# Patient Record
Sex: Female | Born: 1970 | Race: White | Hispanic: No | Marital: Single | Smoking: Never smoker
Health system: Southern US, Community
[De-identification: ages and names within clinical notes are randomized; demographics above are authoritative.]

## PROBLEM LIST (undated history)

## (undated) DIAGNOSIS — F419 Anxiety disorder, unspecified: Secondary | ICD-10-CM

## (undated) DIAGNOSIS — D649 Anemia, unspecified: Secondary | ICD-10-CM

## (undated) DIAGNOSIS — I10 Essential (primary) hypertension: Secondary | ICD-10-CM

## (undated) HISTORY — DX: Essential (primary) hypertension: I10

## (undated) HISTORY — DX: Anemia, unspecified: D64.9

## (undated) HISTORY — DX: Anxiety disorder, unspecified: F41.9

## (undated) HISTORY — PX: TUMOR REMOVAL: SHX12

---

## 2001-04-11 ENCOUNTER — Encounter: Payer: Self-pay | Admitting: Internal Medicine

## 2001-04-11 ENCOUNTER — Encounter: Admission: RE | Admit: 2001-04-11 | Discharge: 2001-04-11 | Payer: Self-pay | Admitting: Internal Medicine

## 2003-07-30 ENCOUNTER — Other Ambulatory Visit: Admission: RE | Admit: 2003-07-30 | Discharge: 2003-07-30 | Payer: Self-pay | Admitting: Internal Medicine

## 2006-08-03 ENCOUNTER — Other Ambulatory Visit: Admission: RE | Admit: 2006-08-03 | Discharge: 2006-08-03 | Payer: Self-pay | Admitting: *Deleted

## 2006-12-05 ENCOUNTER — Emergency Department (HOSPITAL_COMMUNITY): Admission: EM | Admit: 2006-12-05 | Discharge: 2006-12-05 | Payer: Self-pay | Admitting: Emergency Medicine

## 2007-12-30 ENCOUNTER — Encounter: Admission: RE | Admit: 2007-12-30 | Discharge: 2007-12-30 | Payer: Self-pay | Admitting: Orthopedic Surgery

## 2008-05-18 ENCOUNTER — Encounter: Admission: RE | Admit: 2008-05-18 | Discharge: 2008-05-18 | Payer: Self-pay | Admitting: Orthopedic Surgery

## 2008-11-02 ENCOUNTER — Other Ambulatory Visit: Admission: RE | Admit: 2008-11-02 | Discharge: 2008-11-02 | Payer: Self-pay | Admitting: Family Medicine

## 2008-12-02 ENCOUNTER — Encounter: Admission: RE | Admit: 2008-12-02 | Discharge: 2008-12-02 | Payer: Self-pay | Admitting: Orthopedic Surgery

## 2009-04-13 ENCOUNTER — Encounter: Admission: RE | Admit: 2009-04-13 | Discharge: 2009-04-13 | Payer: Self-pay | Admitting: General Surgery

## 2009-04-21 ENCOUNTER — Encounter (INDEPENDENT_AMBULATORY_CARE_PROVIDER_SITE_OTHER): Payer: Self-pay | Admitting: Interventional Radiology

## 2009-04-21 ENCOUNTER — Ambulatory Visit (HOSPITAL_COMMUNITY): Admission: RE | Admit: 2009-04-21 | Discharge: 2009-04-21 | Payer: Self-pay | Admitting: General Surgery

## 2010-02-07 ENCOUNTER — Encounter: Admission: RE | Admit: 2010-02-07 | Discharge: 2010-02-07 | Payer: Self-pay | Admitting: *Deleted

## 2010-02-11 ENCOUNTER — Encounter: Admission: RE | Admit: 2010-02-11 | Discharge: 2010-02-11 | Payer: Self-pay | Admitting: *Deleted

## 2010-08-14 ENCOUNTER — Encounter: Payer: Self-pay | Admitting: *Deleted

## 2010-08-14 ENCOUNTER — Encounter: Payer: Self-pay | Admitting: Orthopedic Surgery

## 2012-04-05 ENCOUNTER — Other Ambulatory Visit: Payer: Self-pay | Admitting: Family Medicine

## 2012-04-05 DIAGNOSIS — Z1231 Encounter for screening mammogram for malignant neoplasm of breast: Secondary | ICD-10-CM

## 2012-04-08 ENCOUNTER — Ambulatory Visit: Payer: Self-pay

## 2012-04-15 ENCOUNTER — Ambulatory Visit
Admission: RE | Admit: 2012-04-15 | Discharge: 2012-04-15 | Disposition: A | Discharge status: Home / Self Care | Payer: 59 | Source: Ambulatory Visit | Attending: Family Medicine | Admitting: Family Medicine

## 2012-04-15 DIAGNOSIS — Z1231 Encounter for screening mammogram for malignant neoplasm of breast: Secondary | ICD-10-CM

## 2012-04-17 ENCOUNTER — Other Ambulatory Visit: Payer: Self-pay | Admitting: Family Medicine

## 2012-04-17 DIAGNOSIS — R921 Mammographic calcification found on diagnostic imaging of breast: Secondary | ICD-10-CM

## 2012-04-17 DIAGNOSIS — R928 Other abnormal and inconclusive findings on diagnostic imaging of breast: Secondary | ICD-10-CM

## 2012-04-25 ENCOUNTER — Ambulatory Visit
Admission: RE | Admit: 2012-04-25 | Discharge: 2012-04-25 | Disposition: A | Discharge status: Home / Self Care | Payer: 59 | Source: Ambulatory Visit | Attending: Family Medicine | Admitting: Family Medicine

## 2012-04-25 DIAGNOSIS — R921 Mammographic calcification found on diagnostic imaging of breast: Secondary | ICD-10-CM

## 2012-04-25 DIAGNOSIS — R928 Other abnormal and inconclusive findings on diagnostic imaging of breast: Secondary | ICD-10-CM

## 2013-01-23 ENCOUNTER — Ambulatory Visit: Payer: 59 | Admitting: Family Medicine

## 2013-01-23 VITALS — BP 147/81 | HR 72 | Temp 98.0°F | Resp 17 | Ht 66.5 in | Wt 150.0 lb

## 2013-01-23 DIAGNOSIS — R51 Headache: Secondary | ICD-10-CM

## 2013-01-23 DIAGNOSIS — J3489 Other specified disorders of nose and nasal sinuses: Secondary | ICD-10-CM

## 2013-01-23 DIAGNOSIS — J029 Acute pharyngitis, unspecified: Secondary | ICD-10-CM

## 2013-01-23 MED ORDER — TRAMADOL HCL 50 MG PO TABS: 50.0000 mg | ORAL_TABLET | Freq: Four times a day (QID) | ORAL | Status: AC | PRN

## 2013-01-23 NOTE — Patient Instructions (Signed)
Drink plenty of fluids and get enough rest  We will let you know if the throat culture comes back positive   Take Tylenol or ibuprofen for headache and facial pain. If the pain is bad go ahead and take a tramadol.   If he gets more congested take a Claritin D. or Allegra D.  Return if worse

## 2013-01-23 NOTE — Progress Notes (Signed)
Subjective: 42 year old lady who is here with a history of having a couple days of a sore throat and not feeling well. Today she with a lot of facial pain. She's had a little seasickness sensation. She has not vomited. Her throat is not as bad this morning. She took some OTC analgesia yesterday. Not blowing a lot of stuff out of her nose. No cough. No documented fever.  Objective: Pleasant lady who appears generally healthy. TMs are normal. Nose clear. Throat erythematous on the posterior pharynx. Strep taken. Neck supple without nodes or thyromegaly. Chest clear. Heart regular.  Assessment: Pharyngitis Facial pain Viral syndrome  Plan: Strep screen Results for orders placed in visit on 01/23/13  POCT RAPID STREP A (OFFICE)      Result Value Range   Rapid Strep A Screen Negative  Negative   Probably a viral process. Reassurance. Treat symptomatically.

## 2013-01-27 ENCOUNTER — Encounter: Payer: Self-pay | Admitting: Family Medicine

## 2013-06-04 ENCOUNTER — Other Ambulatory Visit (HOSPITAL_COMMUNITY)
Admission: RE | Admit: 2013-06-04 | Discharge: 2013-06-04 | Disposition: A | Discharge status: Home / Self Care | Payer: 59 | Source: Ambulatory Visit | Attending: Family Medicine | Admitting: Family Medicine

## 2013-06-04 ENCOUNTER — Other Ambulatory Visit: Payer: Self-pay | Admitting: Family Medicine

## 2013-06-04 DIAGNOSIS — Z01419 Encounter for gynecological examination (general) (routine) without abnormal findings: Secondary | ICD-10-CM | POA: Insufficient documentation

## 2013-06-04 DIAGNOSIS — Z1151 Encounter for screening for human papillomavirus (HPV): Secondary | ICD-10-CM | POA: Insufficient documentation

## 2015-11-09 ENCOUNTER — Other Ambulatory Visit: Payer: Self-pay

## 2015-11-09 DIAGNOSIS — Z1231 Encounter for screening mammogram for malignant neoplasm of breast: Secondary | ICD-10-CM

## 2015-11-10 ENCOUNTER — Ambulatory Visit
Admission: RE | Admit: 2015-11-10 | Discharge: 2015-11-10 | Disposition: A | Discharge status: Home / Self Care | Payer: BLUE CROSS/BLUE SHIELD | Source: Ambulatory Visit

## 2015-11-10 DIAGNOSIS — Z1231 Encounter for screening mammogram for malignant neoplasm of breast: Secondary | ICD-10-CM

## 2016-11-07 ENCOUNTER — Other Ambulatory Visit: Payer: Self-pay | Admitting: Family Medicine

## 2016-11-07 DIAGNOSIS — Z1231 Encounter for screening mammogram for malignant neoplasm of breast: Secondary | ICD-10-CM

## 2016-11-24 ENCOUNTER — Ambulatory Visit
Admission: RE | Admit: 2016-11-24 | Discharge: 2016-11-24 | Disposition: A | Discharge status: Home / Self Care | Payer: BLUE CROSS/BLUE SHIELD | Source: Ambulatory Visit | Attending: Family Medicine | Admitting: Family Medicine

## 2016-11-24 DIAGNOSIS — Z1231 Encounter for screening mammogram for malignant neoplasm of breast: Secondary | ICD-10-CM

## 2017-11-23 ENCOUNTER — Other Ambulatory Visit (HOSPITAL_COMMUNITY)
Admission: RE | Admit: 2017-11-23 | Discharge: 2017-11-23 | Disposition: A | Discharge status: Home / Self Care | Payer: BLUE CROSS/BLUE SHIELD | Source: Ambulatory Visit | Attending: Family Medicine | Admitting: Family Medicine

## 2017-11-23 ENCOUNTER — Other Ambulatory Visit: Payer: Self-pay | Admitting: Family Medicine

## 2017-11-23 DIAGNOSIS — Z124 Encounter for screening for malignant neoplasm of cervix: Secondary | ICD-10-CM | POA: Diagnosis present

## 2017-11-27 LAB — CYTOLOGY - PAP
DIAGNOSIS: NEGATIVE
HPV: NOT DETECTED

## 2019-10-17 ENCOUNTER — Ambulatory Visit: Payer: Self-pay | Attending: Internal Medicine

## 2019-10-17 DIAGNOSIS — Z23 Encounter for immunization: Secondary | ICD-10-CM

## 2019-10-17 NOTE — Progress Notes (Signed)
   Covid-19 Vaccination Clinic  Name:  KHAYLEE MCEVOY    MRN: [id number] DOB: 03-14-1971  10/17/2019  Ms. Suliman was observed post Covid-19 immunization for 15 minutes without incident. She was provided with Vaccine Information Sheet and instruction to access the V-Safe system.   Ms. Shapley was instructed to call 911 with any severe reactions post vaccine: Marland Kitchen Difficulty breathing  . Swelling of face and throat  . A fast heartbeat  . A bad rash all over body  . Dizziness and weakness   Immunizations Administered    Name Date Dose VIS Date Route   Pfizer COVID-19 Vaccine 10/17/2019 12:59 AM 0.3 mL 07/04/2019 Intramuscular   Manufacturer: [employer], [employer]   Lot: [id number]   NDC: [address]-0

## 2019-11-12 ENCOUNTER — Ambulatory Visit: Payer: Self-pay | Attending: Internal Medicine

## 2019-11-12 DIAGNOSIS — Z23 Encounter for immunization: Secondary | ICD-10-CM

## 2019-11-12 NOTE — Progress Notes (Signed)
   Covid-19 Vaccination Clinic  Name:  Katherine Bray    MRN: [id number] DOB: 25-Aug-1970  11/12/2019  [name] was observed post Covid-19 immunization for 15 minutes without incident. She was provided with Vaccine Information Sheet and instruction to access the V-Safe system.   [name] was instructed to call 911 with any severe reactions post vaccine: Marland Kitchen Difficulty breathing  . Swelling of face and throat  . A fast heartbeat  . A bad rash all over body  . Dizziness and weakness   Immunizations Administered    Name Date Dose VIS Date Route   Pfizer COVID-19 Vaccine 11/12/2019 11:34 AM 0.3 mL 09/17/2018 Intramuscular   Manufacturer: [employer], [employer]   Lot: [id number]   NDC: [address]-0

## 2019-11-12 NOTE — Progress Notes (Signed)
COVID-19 Vaccine Information can be found at: [email or web address] For questions related to vaccine distribution or appointments, please email [email or web address] or call [phone].    

## 2019-12-30 ENCOUNTER — Other Ambulatory Visit: Payer: Self-pay | Admitting: Family Medicine

## 2019-12-30 DIAGNOSIS — Z1231 Encounter for screening mammogram for malignant neoplasm of breast: Secondary | ICD-10-CM

## 2019-12-31 ENCOUNTER — Ambulatory Visit: Payer: Self-pay

## 2019-12-31 ENCOUNTER — Ambulatory Visit
Admission: RE | Admit: 2019-12-31 | Discharge: 2019-12-31 | Disposition: A | Discharge status: Home / Self Care | Payer: 59 | Source: Ambulatory Visit | Attending: Family Medicine | Admitting: Family Medicine

## 2019-12-31 ENCOUNTER — Other Ambulatory Visit: Payer: Self-pay

## 2019-12-31 DIAGNOSIS — Z1231 Encounter for screening mammogram for malignant neoplasm of breast: Secondary | ICD-10-CM

## 2021-02-09 ENCOUNTER — Other Ambulatory Visit: Payer: Self-pay | Admitting: Family Medicine

## 2021-02-09 DIAGNOSIS — Z1231 Encounter for screening mammogram for malignant neoplasm of breast: Secondary | ICD-10-CM

## 2021-02-22 ENCOUNTER — Ambulatory Visit
Admission: RE | Admit: 2021-02-22 | Discharge: 2021-02-22 | Disposition: A | Discharge status: Home / Self Care | Payer: 59 | Source: Ambulatory Visit | Attending: Family Medicine | Admitting: Family Medicine

## 2021-02-22 ENCOUNTER — Other Ambulatory Visit: Payer: Self-pay

## 2021-02-22 DIAGNOSIS — Z1231 Encounter for screening mammogram for malignant neoplasm of breast: Secondary | ICD-10-CM

## 2023-03-23 IMAGING — MG MM DIGITAL SCREENING BILAT W/ TOMO AND CAD
8 series · 9 of 24 positions shown · non-contrast
Comparison: Previous exam(s).

CLINICAL DATA: Screening.

EXAM:
DIGITAL SCREENING BILATERAL MAMMOGRAM WITH TOMOSYNTHESIS AND CAD
TECHNIQUE: Bilateral screening digital craniocaudal and mediolateral oblique
mammograms were obtained. Bilateral screening digital breast
tomosynthesis was performed. The images were evaluated with
computer-aided detection.

[R MLO synth-2D]
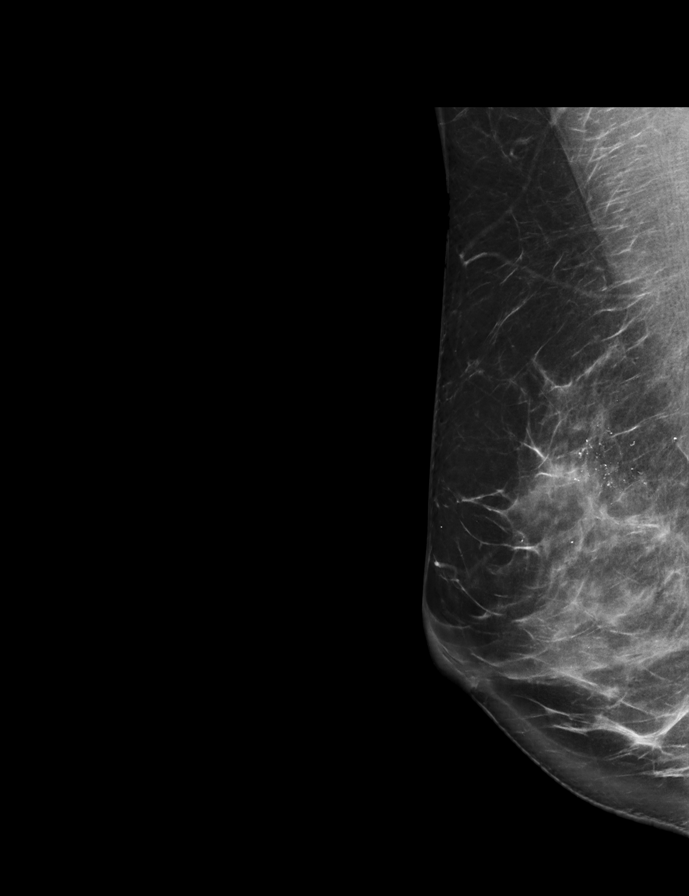

[R CC synth-2D]
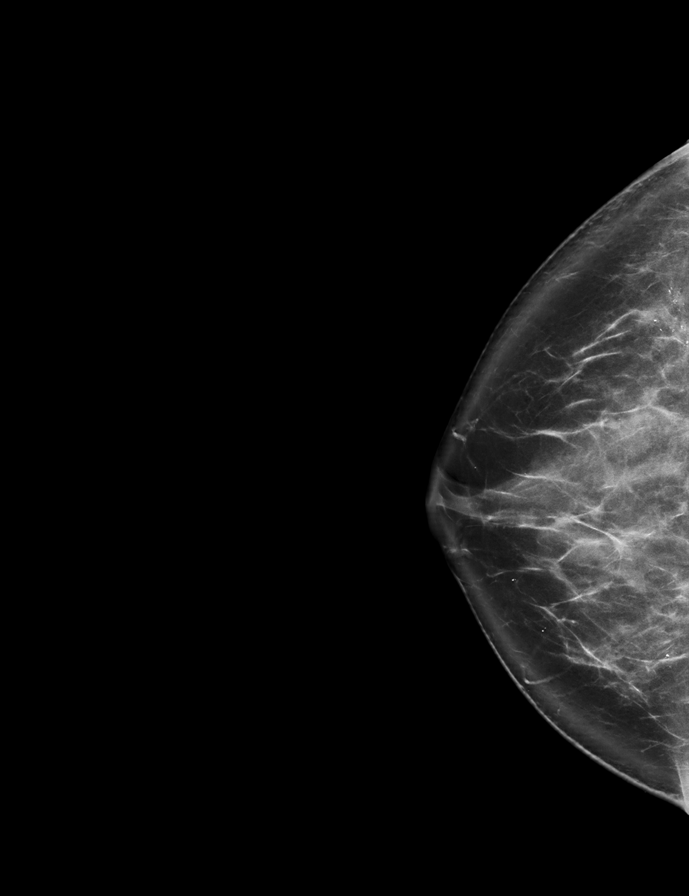

[L MLO synth-2D]
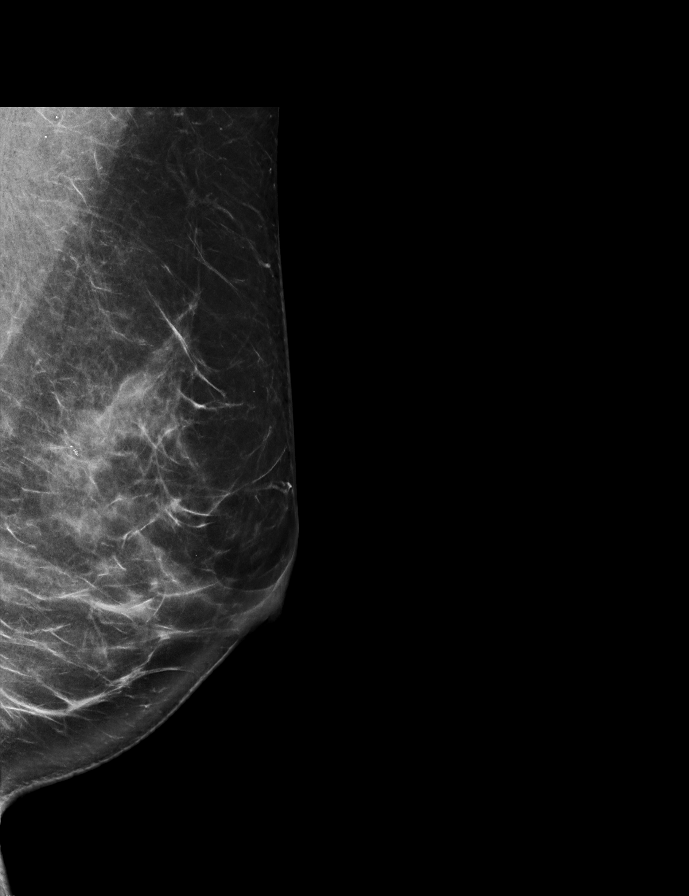

[L CC synth-2D]
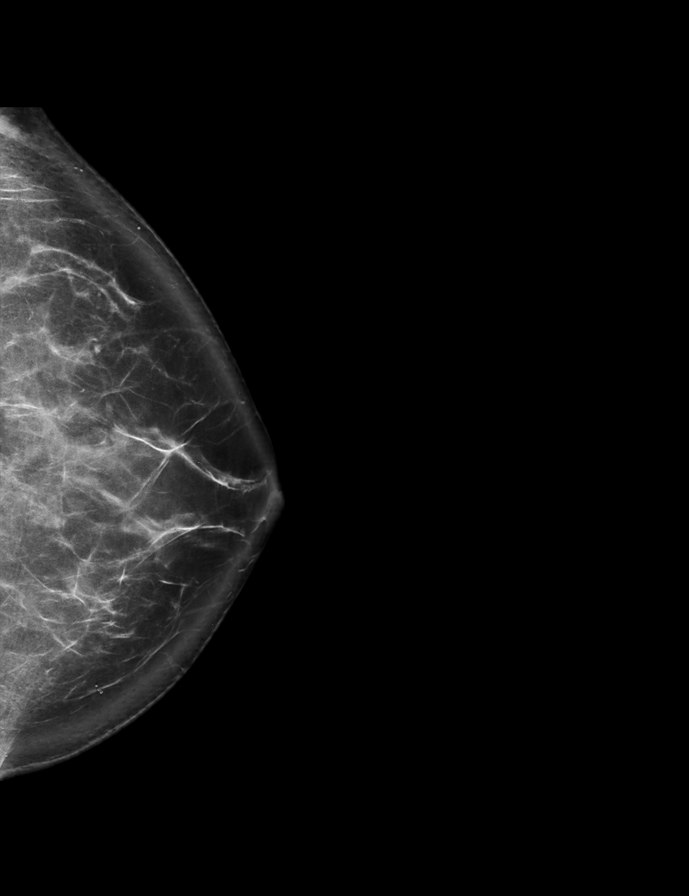

[R CC tomo · 2 of 91 frames shown]
[frame 30/91]
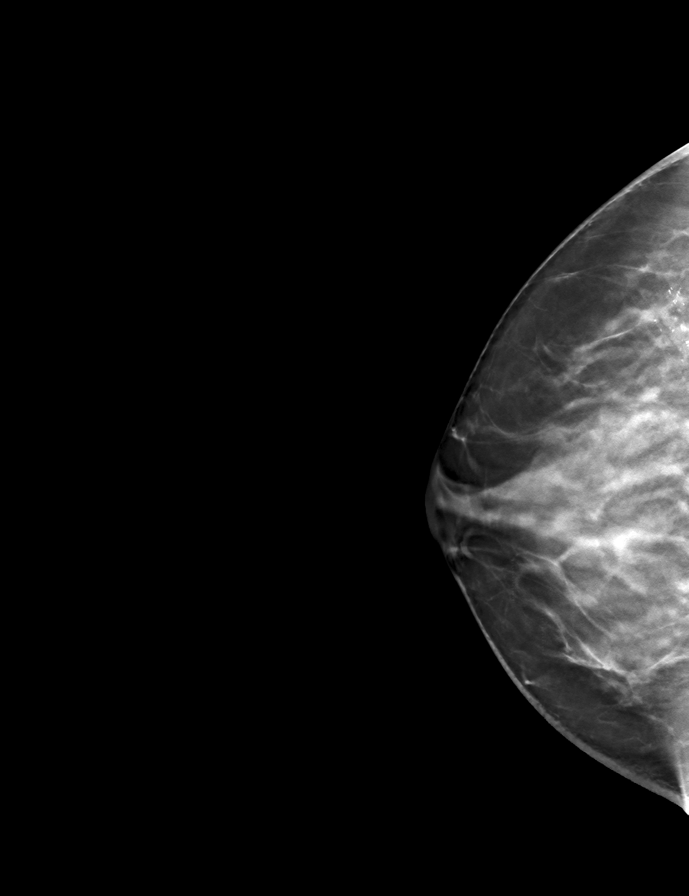
[frame 46/91]
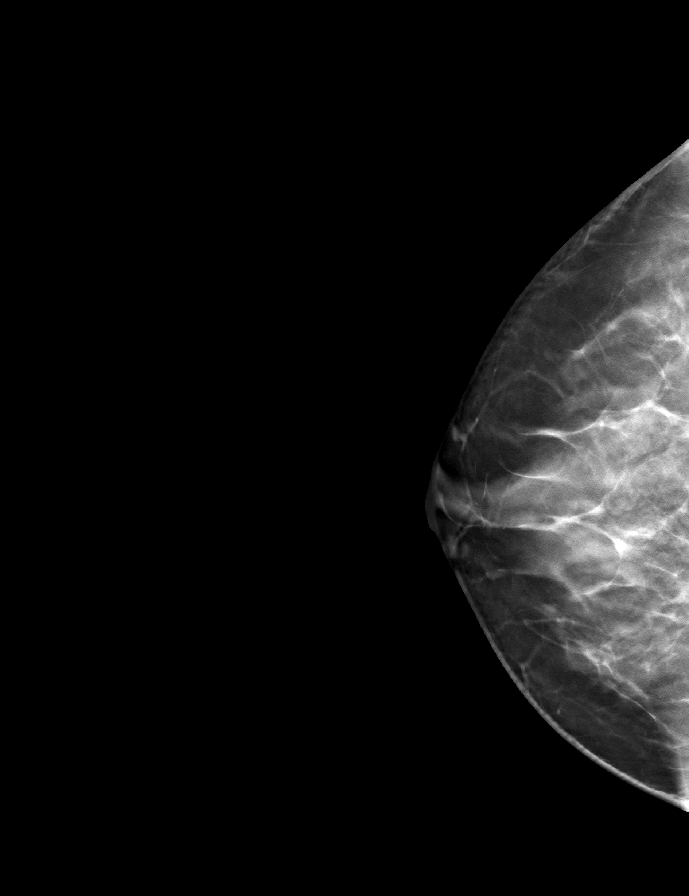

[R MLO tomo · tomo slice 45/89.0]
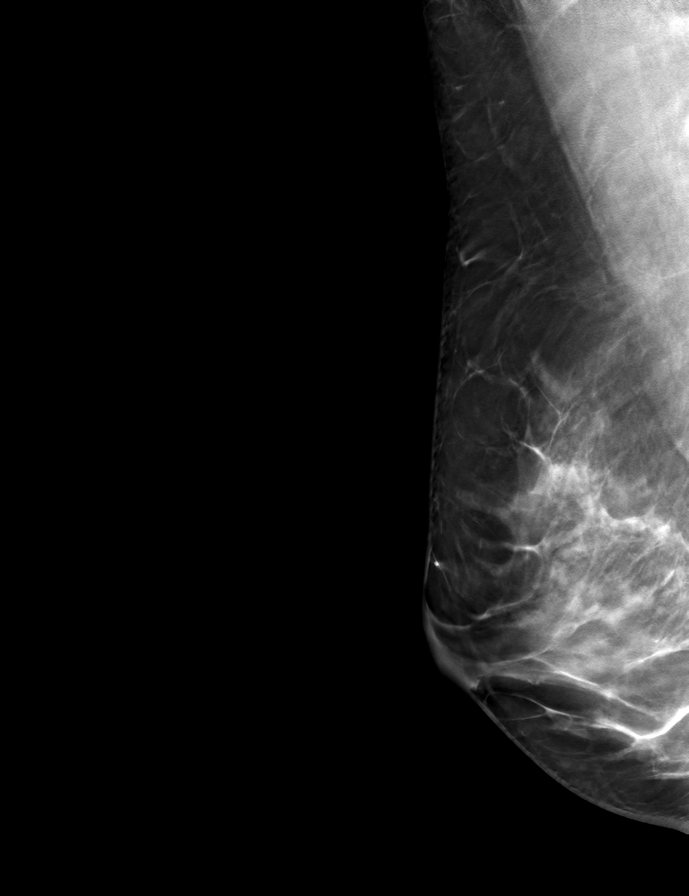

[L CC tomo · tomo slice 47/92.0]
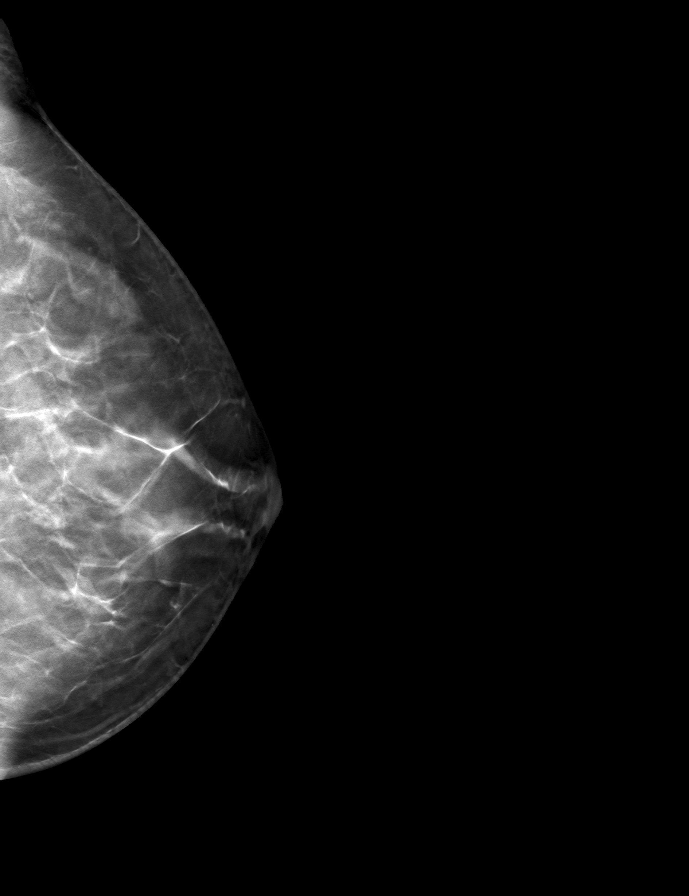

[L MLO tomo · tomo slice 43/85.0]
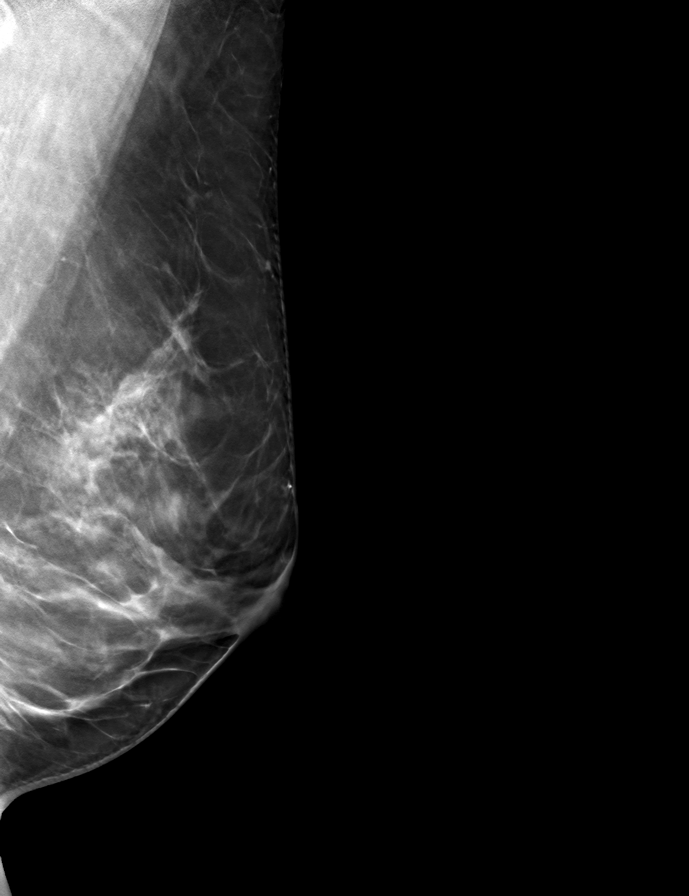

[9 of 24 positions shown; findings below may reference images not displayed]

ACR Breast Density Category c: The breast tissue is heterogeneously
dense, which may obscure small masses.
FINDINGS: There are no findings suspicious for malignancy.
IMPRESSION: No mammographic evidence of malignancy. A result letter of this
screening mammogram will be mailed directly to the patient.

RECOMMENDATION:
Screening mammogram in one year. (Code:[id number])

BI-RADS CATEGORY  1: Negative.

## 2023-06-13 ENCOUNTER — Other Ambulatory Visit: Payer: Self-pay | Admitting: Family Medicine

## 2023-06-13 DIAGNOSIS — Z1231 Encounter for screening mammogram for malignant neoplasm of breast: Secondary | ICD-10-CM

## 2023-07-03 ENCOUNTER — Ambulatory Visit: Payer: 59

## 2023-07-03 ENCOUNTER — Inpatient Hospital Stay
Admission: RE | Admit: 2023-07-03 | Discharge: 2023-07-03 | Discharge status: Home / Self Care | Payer: 59 | Source: Ambulatory Visit | Attending: Family Medicine | Admitting: Family Medicine

## 2023-07-03 DIAGNOSIS — Z1231 Encounter for screening mammogram for malignant neoplasm of breast: Secondary | ICD-10-CM

## 2023-11-09 ENCOUNTER — Other Ambulatory Visit: Payer: Self-pay | Admitting: Family Medicine

## 2023-11-09 DIAGNOSIS — Z9189 Other specified personal risk factors, not elsewhere classified: Secondary | ICD-10-CM

## 2023-11-13 ENCOUNTER — Other Ambulatory Visit: Payer: Self-pay | Admitting: Family Medicine

## 2023-11-13 DIAGNOSIS — Z9189 Other specified personal risk factors, not elsewhere classified: Secondary | ICD-10-CM

## 2023-11-16 ENCOUNTER — Ambulatory Visit
Admission: RE | Admit: 2023-11-16 | Discharge: 2023-11-16 | Disposition: A | Discharge status: Home / Self Care | Source: Ambulatory Visit | Attending: Family Medicine | Admitting: Family Medicine

## 2023-11-16 DIAGNOSIS — Z9189 Other specified personal risk factors, not elsewhere classified: Secondary | ICD-10-CM

## 2023-11-16 MED ORDER — GADOPICLENOL 0.5 MMOL/ML IV SOLN
8.0000 mL | Freq: Once | INTRAVENOUS | Status: AC | PRN
  Administered 2023-11-16: 8 mL via INTRAVENOUS
